# Patient Record
Sex: Female | Born: 1970 | Hispanic: No | Marital: Single | Smoking: Never smoker
Health system: Southern US, Community
[De-identification: ages and names within clinical notes are randomized; demographics above are authoritative.]

## PROBLEM LIST (undated history)

## (undated) DIAGNOSIS — IMO0002 Reserved for concepts with insufficient information to code with codable children: Secondary | ICD-10-CM

## (undated) DIAGNOSIS — J45909 Unspecified asthma, uncomplicated: Secondary | ICD-10-CM

## (undated) DIAGNOSIS — R87619 Unspecified abnormal cytological findings in specimens from cervix uteri: Secondary | ICD-10-CM

## (undated) DIAGNOSIS — Z9889 Other specified postprocedural states: Secondary | ICD-10-CM

## (undated) DIAGNOSIS — F32A Depression, unspecified: Secondary | ICD-10-CM

## (undated) DIAGNOSIS — F419 Anxiety disorder, unspecified: Secondary | ICD-10-CM

## (undated) DIAGNOSIS — F329 Major depressive disorder, single episode, unspecified: Secondary | ICD-10-CM

## (undated) HISTORY — DX: Other specified postprocedural states: Z98.890

## (undated) HISTORY — DX: Reserved for concepts with insufficient information to code with codable children: IMO0002

## (undated) HISTORY — DX: Unspecified asthma, uncomplicated: J45.909

## (undated) HISTORY — DX: Unspecified abnormal cytological findings in specimens from cervix uteri: R87.619

## (undated) HISTORY — PX: WISDOM TOOTH EXTRACTION: SHX21

---

## 1998-10-09 ENCOUNTER — Other Ambulatory Visit: Admission: RE | Admit: 1998-10-09 | Discharge: 1998-10-09 | Payer: Self-pay | Admitting: Obstetrics & Gynecology

## 1999-01-28 ENCOUNTER — Other Ambulatory Visit: Admission: RE | Admit: 1999-01-28 | Discharge: 1999-01-28 | Payer: Self-pay | Admitting: Obstetrics and Gynecology

## 1999-08-28 ENCOUNTER — Other Ambulatory Visit: Admission: RE | Admit: 1999-08-28 | Discharge: 1999-08-28 | Payer: Self-pay | Admitting: Obstetrics & Gynecology

## 1999-11-06 ENCOUNTER — Other Ambulatory Visit: Admission: RE | Admit: 1999-11-06 | Discharge: 1999-11-06 | Payer: Self-pay | Admitting: Obstetrics & Gynecology

## 2000-11-17 ENCOUNTER — Other Ambulatory Visit: Admission: RE | Admit: 2000-11-17 | Discharge: 2000-11-17 | Payer: Self-pay | Admitting: Obstetrics and Gynecology

## 2001-08-11 ENCOUNTER — Ambulatory Visit (HOSPITAL_COMMUNITY): Admission: RE | Admit: 2001-08-11 | Discharge: 2001-08-11 | Payer: Self-pay | Admitting: *Deleted

## 2001-11-17 ENCOUNTER — Other Ambulatory Visit: Admission: RE | Admit: 2001-11-17 | Discharge: 2001-11-17 | Payer: Self-pay | Admitting: Obstetrics and Gynecology

## 2001-11-22 ENCOUNTER — Encounter: Admission: RE | Admit: 2001-11-22 | Discharge: 2001-11-22 | Payer: Self-pay | Admitting: Obstetrics and Gynecology

## 2001-11-22 ENCOUNTER — Encounter: Payer: Self-pay | Admitting: Obstetrics and Gynecology

## 2002-11-22 ENCOUNTER — Other Ambulatory Visit: Admission: RE | Admit: 2002-11-22 | Discharge: 2002-11-22 | Payer: Self-pay | Admitting: Obstetrics and Gynecology

## 2002-11-27 ENCOUNTER — Encounter: Payer: Self-pay | Admitting: Obstetrics and Gynecology

## 2002-11-27 ENCOUNTER — Encounter: Admission: RE | Admit: 2002-11-27 | Discharge: 2002-11-27 | Payer: Self-pay | Admitting: Obstetrics and Gynecology

## 2003-12-04 ENCOUNTER — Other Ambulatory Visit: Admission: RE | Admit: 2003-12-04 | Discharge: 2003-12-04 | Payer: Self-pay | Admitting: Obstetrics and Gynecology

## 2004-09-07 HISTORY — PX: COLPOSCOPY: SHX161

## 2004-12-03 ENCOUNTER — Other Ambulatory Visit: Admission: RE | Admit: 2004-12-03 | Discharge: 2004-12-03 | Payer: Self-pay | Admitting: Obstetrics and Gynecology

## 2005-01-15 ENCOUNTER — Ambulatory Visit: Payer: Self-pay | Admitting: Oncology

## 2005-01-22 ENCOUNTER — Ambulatory Visit (HOSPITAL_COMMUNITY): Admission: RE | Admit: 2005-01-22 | Discharge: 2005-01-22 | Payer: Self-pay | Admitting: Obstetrics and Gynecology

## 2005-01-22 ENCOUNTER — Encounter (INDEPENDENT_AMBULATORY_CARE_PROVIDER_SITE_OTHER): Payer: Self-pay | Admitting: *Deleted

## 2005-06-08 ENCOUNTER — Other Ambulatory Visit: Admission: RE | Admit: 2005-06-08 | Discharge: 2005-06-08 | Payer: Self-pay | Admitting: Obstetrics and Gynecology

## 2005-10-15 ENCOUNTER — Other Ambulatory Visit: Admission: RE | Admit: 2005-10-15 | Discharge: 2005-10-15 | Payer: Self-pay | Admitting: Obstetrics and Gynecology

## 2006-07-07 ENCOUNTER — Other Ambulatory Visit: Admission: RE | Admit: 2006-07-07 | Discharge: 2006-07-07 | Payer: Self-pay | Admitting: Obstetrics and Gynecology

## 2007-09-13 ENCOUNTER — Encounter: Admission: RE | Admit: 2007-09-13 | Discharge: 2007-09-13 | Payer: Self-pay | Admitting: Obstetrics and Gynecology

## 2008-09-13 ENCOUNTER — Encounter: Admission: RE | Admit: 2008-09-13 | Discharge: 2008-09-13 | Payer: Self-pay | Admitting: Oncology

## 2009-09-16 ENCOUNTER — Encounter: Admission: RE | Admit: 2009-09-16 | Discharge: 2009-09-16 | Payer: Self-pay | Admitting: Obstetrics and Gynecology

## 2009-12-20 ENCOUNTER — Encounter: Admission: RE | Admit: 2009-12-20 | Discharge: 2009-12-20 | Payer: Self-pay | Admitting: Obstetrics and Gynecology

## 2010-09-17 ENCOUNTER — Encounter
Admission: RE | Admit: 2010-09-17 | Discharge: 2010-09-17 | Payer: Self-pay | Source: Home / Self Care | Attending: Obstetrics and Gynecology | Admitting: Obstetrics and Gynecology

## 2010-12-28 ENCOUNTER — Emergency Department (HOSPITAL_COMMUNITY)
Admission: EM | Admit: 2010-12-28 | Discharge: 2010-12-28 | Disposition: A | Discharge status: Home / Self Care | Payer: 59 | Attending: Emergency Medicine | Admitting: Emergency Medicine

## 2010-12-28 DIAGNOSIS — S61209A Unspecified open wound of unspecified finger without damage to nail, initial encounter: Secondary | ICD-10-CM | POA: Insufficient documentation

## 2010-12-28 DIAGNOSIS — Z79899 Other long term (current) drug therapy: Secondary | ICD-10-CM | POA: Insufficient documentation

## 2010-12-28 DIAGNOSIS — W292XXA Contact with other powered household machinery, initial encounter: Secondary | ICD-10-CM | POA: Insufficient documentation

## 2011-01-23 NOTE — H&P (Signed)
[name], Kimberly Harding               ACCOUNT NO.:  [id number]   MEDICAL RECORD NO.:  [id number]          PATIENT TYPE:   LOCATION:                                 FACILITY:   PHYSICIAN:  Hal Morales, M.D.DATE OF BIRTH:  09/02/2004   DATE OF ADMISSION:  01/09/2005  DATE OF DISCHARGE:                                HISTORY & PHYSICAL   HISTORY OF PRESENT ILLNESS:  The patient is a 40 year old white single  female, para 0-0-1-0, who presents for cold knife conization of the cervix  for recurrent cervical intraepithelial neoplasia, and for vulvar biopsies  for recurrent vulvar irritation with intermittent ulceration.  The patient  had her last Pap smear on December 03, 2004, which showed low-grade squamous  intraepithelial lesion.      VPH/MEDQ  D:  01/05/2005  T:  01/05/2005  Job:  [id number]

## 2011-01-23 NOTE — H&P (Signed)
Kimberly Harding, Kimberly Harding              ACCOUNT NO.:  [id number]   MEDICAL RECORD NO.:  [id number]           PATIENT TYPE:   LOCATION:                                 FACILITY:   PHYSICIAN:  Hal Morales, M.D.DATE OF BIRTH:  10/07/1970   DATE OF ADMISSION:  01/09/2005  DATE OF DISCHARGE:                                HISTORY & PHYSICAL   HISTORY OF PRESENT ILLNESS:  The patient is a 40 year old white single  female, para 0-0-1-0, who presents with cold knife conization of the cervix  for abnormal genital cytology, and for vulvar biopsies for persistent and  recurrent vulvar irritation with negative work-up to date.  Her past  gynecologic history is significant for the occurrence of an abnormal Pap  smear in 1997 showing low-grade squamous intraepithelial lesion.  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  She  also had chronic cervicitis, but the endocervical curettings were negative.  The patient was treated with cryotherapy, and had normal Pap smears  subsequent to that, except for a Pap smear showing ASCUS in 2000, which  subsequently returned to normal.  Her last Pap smear was done in March of  2006, and showed low-grade squamous intraepithelial lesion.  At that time,  colposcopy was performed, with the finding of no exocervical lesions.  Endocervical curettings, however, did show fragments of dysplastic squamous  mucosa and fragments of benign endocervical mucosa.  In light of the  positive endocervical curettings, a recommendation was made that the patient  undergo cold knife conization of the cervix.   The patient has been seen numerous times over the last several years  complaining of recurrent vulvar irritation.  She has had numerous rounds of  STD testing, including negative gonorrhea and Chlamydia, HIV, hepatitis B  surface antigen, and RPR testing.  Most recently, she underwent HSV-1 and 2  glycoprotein testing, which, likewise, returned  negative.  The patient  continues to have difficulty with bilateral vulvar irritation, sometimes  with ulceration, always with some degree of itching, that has been minimally  responsive to antifungal agents and steroid creams.  She thus wishes to  undergo biopsy for a definitive diagnosis.   PAST OBSTETRICAL HISTORY:  The patient has had one pregnancy which was  terminated in the first trimester greater than 10 years ago.  This was  without complication.   MEDICAL HISTORY:  The patient has a history of exercise-induced asthma, for  which she is treated on an as-needed basis.  She also has a history of  depression.   FAMILY HISTORY:  Significant for hypertension in her mother and her maternal  grandmother.  She likewise has a family history of breast cancer in her  mother, maternal grandmother, and maternal aunt.  She was evaluated at the  [facility] by Dr. Cyndie Chime for this family history to  determine whether she was a candidate for prophylactic therapy.  She  actually, based on her Dondra Spry model, came out with a very low risk of chance  of breast cancer of 0.6% over the next 5 years, compared with a standard  risk  of 0.2%.  It was thus recommended that she was not a candidate for  preventative therapy, but it was recommended that her mother undergo BRCA-1  and BRCA-2 gene testing.   REVIEW OF SYSTEMS:  Negative.   DRUG ALLERGIES:  None known.   CURRENT MEDICATIONS:  1.  Albuterol inhaler p.r.n.  2.  Lexapro 20 mg p.o. daily.  3.  Ambien 10 mg p.o. p.r.n.  4.  Provigil 10 mg p.o. p.r.n.   PHYSICAL EXAMINATION:  GENERAL:  The patient is a well-developed white  female in no acute distress.  VITAL SIGNS:  The blood pressure is 110/72, the weight is 175 pounds.  HEENT:  The thyroid is normal and nontender.  BREASTS:  Without dominant masses, discharge, skin changes, or nipple  retraction.  ABDOMEN:  Soft without masses or organomegaly.  EXTREMITIES:  No clubbing,  cyanosis, or edema.  PELVIC:  EG/BUS within normal limits.  The vagina is rugose.  The cervix is  without gross lesions.  The uterus is normal size, shape, consistency,  anterior, mobile, and nontender.  Adnexa with no masses.  RECTOVAGINAL:  No masses.   IMPRESSION:  1.  Recurrent low-grade squamous epithelial lesion with no exocervical      lesions, and a positive endocervical curetting.  2.  Recurrent vulvar lesion with negative sexually transmitted disease      testing including HSV-1 and 2 glycoprotein.   DISPOSITION:  The recommendation is made to the patient for conization of  the cervix for diagnosis and therapy.  The risks of anesthesia, bleeding,  infection, and damage to adjacent organs are all reviewed.  The risk of  preterm labor and delivery is likewise reviewed with the patient.  She  states she wishes to proceed with the cold knife conization of cervix, as  well as biopsies of the vulva, for definitive diagnosis of her vulvar  irritation.  This will be performed at [facility] on Jan 22, 2005.      VPH/MEDQ  D:  01/05/2005  T:  01/05/2005  Job:  [id number]

## 2011-01-23 NOTE — Op Note (Signed)
Kimberly Harding, Kimberly Harding              ACCOUNT NO.:  [id number]   MEDICAL RECORD NO.:  [id number]          PATIENT TYPE:  AMB   LOCATION:  [facility]                           FACILITY:  [facility]   PHYSICIAN:  Hal Morales, M.D.DATE OF BIRTH:  September 20, 1970   DATE OF PROCEDURE:  01/22/2005  DATE OF DISCHARGE:                                 OPERATIVE REPORT   PREOPERATIVE DIAGNOSES:  1.  Low-grade squamous intraepithelial lesion on Papanicolaou smear with a      positive endocervical curetting, rule out endocervical dysplasia.  2.  Pigmented vulvar lesion.  3.  Vulvar irritation.   POSTOPERATIVE DIAGNOSES:  1.  Low-grade squamous intraepithelial lesion on Papanicolaou smear with a      positive endocervical curetting, rule out endocervical dysplasia.  2.  Pigmented vulvar lesion.  3.  Vulvar irritation.   OPERATION:  1.  Cold knife conization of the cervix.  2.  Vulvar biopsies.   SURGEON:  Hal Morales, M.D.   ANESTHESIA:  Monitored anesthesia care and local.   ESTIMATED BLOOD LOSS:  Less than 10 mL.   COMPLICATIONS:  None.   FINDINGS:  There were small pigmented vulvar lesions at the posterior  fourchette on the right and left side.  There was an area and the left groin  at the at the level of the left vulva, which was irritated and excoriated.   PROCEDURE:  The patient was taken to the operating room after appropriate  identification, placed on the operating table.  After placement of equipment  for monitored anesthesia care, she was placed in the lithotomy position.  The perineum and vagina were prepped with multiple layers of Betadine and  draped as a sterile field.  A red Robinson catheter was used to empty the  bladder.  The aforementioned lesions on the vulva were infiltrated with  0.25% Marcaine.  A Graves speculum was placed in the vagina and a  paracervical block achieved with a total of 10 mL of  2% Xylocaine in the 5  and 7 o'clock positions.  The cervix was then  infiltrated with a dilute solution of Pitressin.  Sutures were placed at the 3 and 9 o'clock positions on the cervix and tied  down.  A cone-shaped specimen of tissue including the endocervix was then  excised.  The endocervical area was then curetted.  Sturmdorf sutures were  placed at 6 and 9 o'clock and a piece of Gelfoam placed in the conization  bed.  Hemostasis was noted to be adequate.  A 4 mm punch biopsy was used to  obtain a biopsy from the right posterior fourchette and the left posterior  fourchette and the left groin.  The skin defects were then closed with  subcuticular sutures of 3-0 Vicryl.  Hemostasis was noted to be adequate and  all instruments were removed from the vagina.  The patient was then taken  from the operating room to the recovery room in satisfactory condition  having tolerated the procedure well, with sponge and instrument count  correct.   SPECIMENS TO PATHOLOGY:  1.  Conization of the cervix.  2.  Endocervical, endometrial curettage.  3.  Left posterior fourchette and left groin biopsies.  4.  Right posterior fourchette biopsy.      VPH/MEDQ  D:  01/22/2005  T:  01/22/2005  Job:  [id number]

## 2011-08-10 ENCOUNTER — Other Ambulatory Visit: Payer: Self-pay | Admitting: Oncology

## 2011-08-10 DIAGNOSIS — Z1231 Encounter for screening mammogram for malignant neoplasm of breast: Secondary | ICD-10-CM

## 2011-09-21 ENCOUNTER — Ambulatory Visit
Admission: RE | Admit: 2011-09-21 | Discharge: 2011-09-21 | Disposition: A | Discharge status: Home / Self Care | Payer: 59 | Source: Ambulatory Visit | Attending: Oncology | Admitting: Oncology

## 2011-09-21 DIAGNOSIS — Z1231 Encounter for screening mammogram for malignant neoplasm of breast: Secondary | ICD-10-CM

## 2012-05-17 ENCOUNTER — Other Ambulatory Visit: Payer: Self-pay

## 2012-05-17 ENCOUNTER — Telehealth: Payer: Self-pay | Admitting: Obstetrics and Gynecology

## 2012-05-17 DIAGNOSIS — Z8639 Personal history of other endocrine, nutritional and metabolic disease: Secondary | ICD-10-CM

## 2012-05-17 NOTE — Telephone Encounter (Signed)
CHANDRA/FOLLOW UP

## 2012-05-17 NOTE — Telephone Encounter (Signed)
Lm on vm to cb per telephone call.  

## 2012-05-17 NOTE — Telephone Encounter (Signed)
Tc from pt. Informed pt repeat Vit D level was due in 01/2012 after rx Vit D 50K IU was pres for pt in 10/2011 x 12 weeks. Appt sched 05/18/12 2:00p with lab only for Vit D level. Pt voices understanding.

## 2012-05-18 ENCOUNTER — Other Ambulatory Visit: Payer: BC Managed Care – PPO

## 2012-05-18 DIAGNOSIS — Z8639 Personal history of other endocrine, nutritional and metabolic disease: Secondary | ICD-10-CM

## 2012-06-29 ENCOUNTER — Telehealth: Payer: Self-pay | Admitting: Obstetrics and Gynecology

## 2012-06-29 NOTE — Telephone Encounter (Signed)
Gave pt vitamin D results, informed that it was within normal range, pt wants to know if she is still supposed to get vitamin d rx.

## 2012-06-29 NOTE — Telephone Encounter (Signed)
Returned call to pt, advised that she no longer needs rx for vitamin d but to continue daily supplement consisting of calcium and vitamin d. Pt advised to take [phone] mg of calcium along with [phone] units of vitamin d. Pt agreeable and voiced understanding.

## 2012-06-29 NOTE — Telephone Encounter (Signed)
Kimberly Harding to address.

## 2012-08-16 ENCOUNTER — Other Ambulatory Visit: Payer: Self-pay | Admitting: Oncology

## 2012-08-16 DIAGNOSIS — Z1231 Encounter for screening mammogram for malignant neoplasm of breast: Secondary | ICD-10-CM

## 2012-09-21 ENCOUNTER — Ambulatory Visit
Admission: RE | Admit: 2012-09-21 | Discharge: 2012-09-21 | Disposition: A | Discharge status: Home / Self Care | Payer: BC Managed Care – PPO | Source: Ambulatory Visit | Attending: Oncology | Admitting: Oncology

## 2012-09-21 DIAGNOSIS — Z1231 Encounter for screening mammogram for malignant neoplasm of breast: Secondary | ICD-10-CM

## 2012-09-27 ENCOUNTER — Other Ambulatory Visit: Payer: Self-pay | Admitting: Oncology

## 2012-09-27 DIAGNOSIS — R928 Other abnormal and inconclusive findings on diagnostic imaging of breast: Secondary | ICD-10-CM

## 2012-10-05 ENCOUNTER — Other Ambulatory Visit: Payer: Self-pay | Admitting: Oncology

## 2012-10-05 ENCOUNTER — Ambulatory Visit
Admission: RE | Admit: 2012-10-05 | Discharge: 2012-10-05 | Disposition: A | Discharge status: Home / Self Care | Payer: BC Managed Care – PPO | Source: Ambulatory Visit | Attending: Oncology | Admitting: Oncology

## 2012-10-05 DIAGNOSIS — N63 Unspecified lump in unspecified breast: Secondary | ICD-10-CM

## 2012-10-05 DIAGNOSIS — R928 Other abnormal and inconclusive findings on diagnostic imaging of breast: Secondary | ICD-10-CM

## 2012-10-06 ENCOUNTER — Telehealth: Payer: Self-pay | Admitting: *Deleted

## 2012-10-06 NOTE — Telephone Encounter (Signed)
Pt notified of benign breast bx report per Dr Cyndie Chime.  [facility] & is very pleased.

## 2012-10-06 NOTE — Telephone Encounter (Signed)
Message copied by Sabino Snipes on Thu Oct 06, 2012  1:34 PM ------      Message from: Levert Feinstein      Created: Thu Oct 06, 2012 11:29 AM       Call pt breast biopsy  benign

## 2012-10-12 ENCOUNTER — Encounter: Payer: Self-pay | Admitting: Obstetrics and Gynecology

## 2012-10-17 ENCOUNTER — Ambulatory Visit: Payer: BC Managed Care – PPO | Admitting: Obstetrics and Gynecology

## 2012-10-17 ENCOUNTER — Encounter: Payer: Self-pay | Admitting: Obstetrics and Gynecology

## 2012-10-17 VITALS — BP 112/78 | Ht 67.0 in | Wt 165.0 lb

## 2012-10-17 DIAGNOSIS — N760 Acute vaginitis: Secondary | ICD-10-CM

## 2012-10-17 DIAGNOSIS — B9689 Other specified bacterial agents as the cause of diseases classified elsewhere: Secondary | ICD-10-CM | POA: Insufficient documentation

## 2012-10-17 NOTE — Progress Notes (Signed)
aexLast Pap: 10/2011 WNL: bv  Regular Periods:yes Contraception: condoms  Monthly Breast exam:no Tetanus<20yrs:yes Nl.Bladder Function:yes Daily BMs:yes Healthy Diet:yes Calcium:yes Mammogram:yes Date of Mammogram: 10/05/2012 with left U/S and needle localization bx showing fibradenoma Exercise:yes Have often Exercise: 3 times a week  Seatbelt: yes Abuse at home: no Stressful work:yes Sigmoid-colonoscopy: yes 2009 Bone Density: No PCP: Dr.Miller  Change in PMH: unchanged  Change in NWG:NFAOZHYQM.  Mom had breast cancer at age 66 Pt wants to discuss other Birth Control options  Subjective:    Kimberly Harding is a 42 y.o. female, G1P0010, who presents for an annual exam. No complaints.    History   Social History  . Marital Status: Single    Spouse Name: N/A    Number of Children: N/A  . Years of Education: N/A   Social History Main Topics  . Smoking status: Never Smoker   . Smokeless tobacco: Never Used  . Alcohol Use: Yes  . Drug Use: No  . Sexually Active: Yes    Birth Control/ Protection: Condom   Other Topics Concern  . None   Social History Narrative  . None    Menstrual cycle:   LMP: Patient's last menstrual period was 09/23/2012.           Cycle: Regular without Im bleeding .  Cramps relieved by OTC NSAIDS  The following portions of the patient's history were reviewed and updated as appropriate: allergies, current medications, past family history, past medical history, past social history, past surgical history and problem list.  Review of Systems Pertinent items are noted in HPI. Breast:Negative for breast lump,nipple discharge or nipple retraction Gastrointestinal: Negative for abdominal pain, change in bowel habits or rectal bleeding Urinary:negative   Objective:    BP 112/78  Ht 5\' 7"  (1.702 m)  Wt 165 lb (74.844 kg)  BMI 25.84 kg/m2  LMP 09/23/2012    Weight:  Wt Readings from Last 1 Encounters:  10/17/12 165 lb (74.844 kg)          BMI:  Body mass index is 25.84 kg/(m^2).  General Appearance: Alert, appropriate appearance for age. No acute distress HEENT: Grossly normal Neck / Thyroid: Supple, no masses, nodes or enlargement Lungs: clear to auscultation bilaterally Back: No CVA tenderness Breast Exam: No masses or nodes.No dimpling, nipple retraction or discharge. Cardiovascular: Regular rate and rhythm. S1, S2, no murmur Gastrointestinal: Soft, non-tender, no masses or organomegaly Pelvic Exam: Vulva and vagina appear normal. Bimanual exam reveals normal uterus and adnexa. Rectovaginal: normal rectal, no masses Lymphatic Exam: Non-palpable nodes in neck, clavicular, axillary, or inguinal regions Skin: no rash or abnormalities Neurologic: Normal gait and speech, no tremor  Psychiatric: Alert and oriented, appropriate affect.   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  Will call if becomes sexually active with decision re contraception.  Written info re options given.  No current contraindication to any method.

## 2012-10-17 NOTE — Patient Instructions (Addendum)
Contraception Choices  Birth control (contraception) can stop pregnancy from happening. Different types of birth control work in different ways. Some can:  · Make the mucus in the cervix thick. This makes it hard for sperm to get into the uterus.  · Thin the lining of the uterus. This makes it hard for an egg to attach to the wall of the uterus.  · Stop the ovaries from releasing an egg.  · Block the sperm from reaching the egg.  Certain types of surgery can stop pregnancy from happening. For women, the sugery closes the fallopian tubes (tubal ligation). For men, the surgery stops sperm from releasing during sex (vasectomy).  HORMONAL BIRTH CONTROL  Hormonal birth control stops pregnancy by putting hormones into your body. Types of birth control include:  · A small tube put under the skin of the upper arm (implant). The tube can stay in place for 3 years.  · Shots given every 3 months.  · Pills taken every day or once after sex (intercourse).  · Patches that are changed once a week.  · A ring put into the vagina (vaginal ring). The ring is left in place for 3 weeks and removed for 1 week. Then, a new ring is put in the vagina.  BARRIER BIRTH CONTROL   Barrier birth control blocks sperm from reaching the egg. Types of birth control include:   · A thin covering worn on the penis (female condom) during sex.  · A soft, loose covering put into the vagina (female condom) before sex.  · A rubber bowl that sits over the cervix (diaphragm). The bowl must be made for you. The bowl is put into the vagina before sex. The bowl is left in place for 6 to 8 hours after sex.  · A small, soft cup that fits over the cervix (cervical cap). The cup must be made for you. The cup can be left in place for 48 hours after sex.  · A sponge that is put into the vagina before sex.  · A chemical that kills or blocks sperm from getting into the cervix and uterus (spermicide). The chemical may be a cream, jelly, foam, or pill.  INTRAUTERINE (IUD)  BIRTH CONTROL   IUD birth control is a small, T-shaped piece of plastic. The plastic is put inside the uterus. There are 2 types of IUD:  · Copper IUD. The IUD is covered in copper wire. The copper makes a fluid that kills sperm. It can stay in place for 10 years.  · Hormone IUD. The hormone stops pregnancy from happening. It can stay in place for 5 years.  NATURAL FAMILY PLANNING BIRTH CONTROL   Natural family planning means not having sex or using barrier birth control when the woman is fertile. A woman can:  · Use a calendar to keep track of when she is fertile.  · Use a thermometer to measure her body temperature.  Protect yourself against sexual diseases no matter what type of birth control you use. Talk to your doctor about which type of birth control is best for you.  Document Released: 06/21/2009 Document Revised: 11/16/2011 Document Reviewed: 12/31/2010  Exit[employer]® Patient Information ©2013 [employer].

## 2013-09-28 ENCOUNTER — Other Ambulatory Visit: Payer: Self-pay

## 2013-09-28 DIAGNOSIS — Z1231 Encounter for screening mammogram for malignant neoplasm of breast: Secondary | ICD-10-CM

## 2013-10-13 ENCOUNTER — Ambulatory Visit
Admission: RE | Admit: 2013-10-13 | Discharge: 2013-10-13 | Disposition: A | Discharge status: Home / Self Care | Payer: BC Managed Care – PPO | Source: Ambulatory Visit

## 2013-10-13 DIAGNOSIS — Z1231 Encounter for screening mammogram for malignant neoplasm of breast: Secondary | ICD-10-CM

## 2013-11-03 ENCOUNTER — Emergency Department (HOSPITAL_COMMUNITY)
Admission: EM | Admit: 2013-11-03 | Discharge: 2013-11-03 | Disposition: A | Discharge status: Home / Self Care | Payer: BC Managed Care – PPO | Attending: Emergency Medicine | Admitting: Emergency Medicine

## 2013-11-03 ENCOUNTER — Encounter (HOSPITAL_COMMUNITY): Payer: Self-pay | Admitting: Emergency Medicine

## 2013-11-03 DIAGNOSIS — W261XXA Contact with sword or dagger, initial encounter: Secondary | ICD-10-CM

## 2013-11-03 DIAGNOSIS — W260XXA Contact with knife, initial encounter: Secondary | ICD-10-CM | POA: Insufficient documentation

## 2013-11-03 DIAGNOSIS — J45909 Unspecified asthma, uncomplicated: Secondary | ICD-10-CM | POA: Insufficient documentation

## 2013-11-03 DIAGNOSIS — S61219A Laceration without foreign body of unspecified finger without damage to nail, initial encounter: Secondary | ICD-10-CM

## 2013-11-03 DIAGNOSIS — Y9389 Activity, other specified: Secondary | ICD-10-CM | POA: Insufficient documentation

## 2013-11-03 DIAGNOSIS — Z79899 Other long term (current) drug therapy: Secondary | ICD-10-CM | POA: Insufficient documentation

## 2013-11-03 DIAGNOSIS — S61209A Unspecified open wound of unspecified finger without damage to nail, initial encounter: Secondary | ICD-10-CM | POA: Insufficient documentation

## 2013-11-03 DIAGNOSIS — Y929 Unspecified place or not applicable: Secondary | ICD-10-CM | POA: Insufficient documentation

## 2013-11-03 NOTE — Discharge Instructions (Signed)
Stitches, Staples, or Skin Adhesive Strips  Stitches (sutures), staples, and skin adhesive strips hold the skin together as it heals. They will usually be in place for 7 days or less. HOME CARE  Wash your hands with soap and water before and after you touch your wound.  Only take medicine as told by your doctor.  Cover your wound only if your doctor told you to. Otherwise, leave it open to air.  Do not get your stitches wet or dirty. If they get dirty, dab them gently with a clean washcloth. Wet the washcloth with soapy water. Do not rub. Pat them dry gently.  Do not put medicine or medicated cream on your stitches unless your doctor told you to.  Do not take out your own stitches or staples. Skin adhesive strips will fall off by themselves.  Do not pick at the wound. Picking can cause an infection.  Do not miss your follow-up appointment.  If you have problems or questions, call your doctor. GET HELP RIGHT AWAY IF:   You have a temperature by mouth above 102 F (38.9 C), not controlled by medicine.  You have chills.  You have redness or pain around your stitches.  There is puffiness (swelling) around your stitches.  You notice fluid (drainage) from your stitches.  There is a bad smell coming from your wound. MAKE SURE YOU:  Understand these instructions.  Will watch your condition.  Will get help if you are not doing well or get worse. Document Released: 06/21/2009 Document Revised: 11/16/2011 Document Reviewed: 06/21/2009 [facility] Patient Information 2014 [place], [place].

## 2013-11-03 NOTE — ED Provider Notes (Signed)
CSN: [id number]     Arrival date & time 11/03/13  1414 History  This chart was scribed for non-physician practitioner, Charlann Lange, PA-C working with Saddie Benders. Dorna Mai, MD by Einar Pheasant, ED scribe. This patient was seen in room TR08C/TR08C and the patient's care was started at 4:10 PM.    Chief Complaint  Patient presents with  . Laceration    The history is provided by the patient. No language interpreter was used.   HPI Comments: Kimberly Harding is a 43 y.o. female who presents to the Emergency Department complaining of a laceration to the index finger of her left hand that occurred about 2 hours ago. Pt states she cut herself while cutting chicken. She states that the knife had already cut through the chicken when it cut her finger. She currently rates her associated pain as a 6/10. Pt does not recall when her tetanus vaccine was, but she does state that it was within the last 5 years. She denies any pertinent medical history. Denies any recent injuries other than what brought her to the ED today.    Past Medical History  Diagnosis Date  . Abnormal Pap smear   . Asthma   . H/O colposcopy with cervical biopsy    Past Surgical History  Procedure Laterality Date  . Wisdom tooth extraction     Family History  Problem Relation Age of Onset  . Arthritis Mother   . Breast cancer Mother   . Emphysema Maternal Grandmother   . Asthma Maternal Grandmother   . COPD Maternal Grandmother   . Breast cancer Maternal Grandmother   . Stroke Maternal Grandfather   . Colon cancer Paternal Grandmother   . Stroke Paternal Grandmother    History  Substance Use Topics  . Smoking status: Never Smoker   . Smokeless tobacco: Never Used  . Alcohol Use: Yes   OB History   Grav Para Term Preterm Abortions TAB SAB Ect Mult Living   1    1  1    0     Review of Systems  Skin: Positive for wound (laceration to left index finger).      Allergies  Review of patient's allergies indicates no  known allergies.  Home Medications   Current Outpatient Rx  Name  Route  Sig  Dispense  Refill  . ALPRAZolam (XANAX) 0.25 MG tablet   Oral   Take 0.25 mg by mouth at bedtime as needed for sleep.         Marland Kitchen escitalopram (LEXAPRO) 20 MG tablet   Oral   Take 20 mg by mouth daily.         Marland Kitchen zolpidem (AMBIEN) 10 MG tablet   Oral   Take 10 mg by mouth at bedtime as needed for sleep.          Triage Vitals: BP 140/88  Pulse 104  Temp(Src) 99.1 F (37.3 C) (Oral)  Resp 18  SpO2 97%  LMP 10/20/2013  Physical Exam  Nursing note and vitals reviewed. Constitutional: She is oriented to person, place, and time. She appears well-developed and well-nourished.  HENT:  Head: Normocephalic and atraumatic.  Cardiovascular: Normal rate.   Pulmonary/Chest: Effort normal.  Abdominal: She exhibits no distension.  Neurological: She is alert and oriented to person, place, and time.  Skin: Skin is warm and dry.  Left distal and palmar distal finger has a flap laceration intact skin, no swelling, no active bleed, and full ROM of the digit.  Psychiatric: She  has a normal mood and affect.    ED Course  Procedures (including critical care time)  DIAGNOSTIC STUDIES: Oxygen Saturation is 97% on RA, adequate by my interpretation.    COORDINATION OF CARE: 4:12 PM-Will clean out the laceration. Advised pt that she will not need to get her finger sutured. Pt advised of plan for treatment and pt agrees.  Labs Review Labs Reviewed - No data to display Imaging Review No results found.  EKG Interpretation  None  MDM   Final diagnoses:  None    1. Finger laceration  No sutures required for adequate closure. Dermabond and steristrips used after disinfecting.   I personally performed the services described in this documentation, which was scribed in my presence. The recorded information has been reviewed and is accurate.     Dewaine Oats, PA-C 11/07/13 1318

## 2013-11-03 NOTE — ED Notes (Signed)
Laceration wrapped bulky in gauze and tape after [name], PA steri-stripped and dermabonded wound

## 2013-11-03 NOTE — ED Notes (Signed)
Per pt sts cut her left index finger while slicing chicken. sts last tetanus shot was 2 years ago.

## 2013-11-03 NOTE — ED Notes (Signed)
VS rechecked; pt stated she felt fine; pt stated that she knows that this incident is because she "is in her head" and needs to think positive; pt seems worried that pain will continue in greater magnitude after discharge; pt was consoled and reassured that she may be in pain but not great pain and to keep wound clean and dressing changed.  VS were good upon discharge and pt was walked to registration/discharge without incident.

## 2013-11-14 NOTE — ED Provider Notes (Signed)
Medical screening examination/treatment/procedure(s) were performed by non-physician practitioner and as supervising physician I was immediately available for consultation/collaboration.   EKG Interpretation None        Saddie Benders. Eular Panek, MD 11/14/13 1351

## 2013-12-28 ENCOUNTER — Other Ambulatory Visit: Payer: Self-pay | Admitting: Dermatology

## 2014-06-21 ENCOUNTER — Other Ambulatory Visit: Payer: Self-pay | Admitting: Obstetrics and Gynecology

## 2014-06-21 DIAGNOSIS — N6320 Unspecified lump in the left breast, unspecified quadrant: Principal | ICD-10-CM

## 2014-06-21 DIAGNOSIS — N6325 Unspecified lump in the left breast, overlapping quadrants: Secondary | ICD-10-CM

## 2014-06-28 ENCOUNTER — Other Ambulatory Visit: Payer: BC Managed Care – PPO

## 2014-07-09 ENCOUNTER — Encounter (HOSPITAL_BASED_OUTPATIENT_CLINIC_OR_DEPARTMENT_OTHER): Payer: Self-pay | Admitting: *Deleted

## 2014-07-09 ENCOUNTER — Other Ambulatory Visit (INDEPENDENT_AMBULATORY_CARE_PROVIDER_SITE_OTHER): Payer: Self-pay | Admitting: Surgery

## 2014-07-09 NOTE — Progress Notes (Signed)
No labs needed

## 2014-07-10 NOTE — H&P (Signed)
Kimberly Harding 07/09/2014 10:18 AM Location: [facility] Patient #: [id number] DOB: 08-22-1971 Single / Language: Kimberly Harding / Race: White Female  History of Present Illness (Rhea Thrun A. Ninfa Linden MD; 07/09/2014 10:46 AM) Patient words: Lump on LT breast, possible lumpectomy.  The patient is a 43 year old female who presents with a breast mass. She is referred by Dr. Marcelo Baldy for evaluation of a left breast mass. 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 It has however increased in size from 1-1.5 cm. She also has a strong family history of breast cancer. Excision of the mass has been strongly recommended. She has no previous history of other breast masses. She denies nipple discharge. Her mother was diagnosed with breast cancer at age 6   Other Problems Festus Holts, LPN; 65/12/6501 54:65 AM) Anxiety Disorder Asthma Depression Gastroesophageal Reflux Disease Hemorrhoids Lump In Breast  Past Surgical History Festus Holts, LPN; 68/09/2749 70:01 AM) Breast Biopsy Left. Oral Surgery  Diagnostic Studies History Festus Holts, LPN; 74/05/4495 75:91 AM) Colonoscopy 5-10 years ago Mammogram within last year Pap Smear 1-5 years ago  Allergies Festus Holts, LPN; 63/04/4664 99:35 AM) No Known Drug Allergies11/10/2013  Medication History Festus Holts, LPN; 70/09/7791 90:30 AM) Xanax (0.25MG Tablet, Oral) Active. Lexapro (20MG Tablet, Oral) Active. Ambien (10MG Tablet, Oral) Active. Abilify (2MG Tablet, Oral) Active.  Social History Festus Holts, LPN; 05/09/3299 76:22 AM) Alcohol use Occasional alcohol use. Caffeine use Carbonated beverages, Coffee, Tea. No drug use Tobacco use Never smoker.  Family History Festus Holts, LPN; 63/11/3543 62:56 AM) Alcohol Abuse Father. Arthritis Mother. Breast Cancer Family Members In General, Mother. Cerebrovascular Accident Family Members In [place]  Members In General. Depression Brother. Hypertension Mother.  Pregnancy / Birth History Festus Holts, LPN; 38/05/3733 28:76 AM) Age at menarche 39 years. Contraceptive History Depo-provera, Oral contraceptives. Gravida 1 Maternal age 57-20 Para 0 Regular periods  Review of Systems Festus Holts LPN; 81/09/5724 20:35 AM) General Not Present- Appetite Loss, Chills, Fatigue, Fever, Night Sweats, Weight Gain and Weight Loss. Skin Present- Dryness. Not Present- Change in Wart/Mole, Hives, Jaundice, New Lesions, Non-Healing Wounds, Rash and Ulcer. HEENT Present- Seasonal Allergies. Not Present- Earache, Hearing Loss, Hoarseness, Nose Bleed, Oral Ulcers, Ringing in the Ears, Sinus Pain, Sore Throat, Visual Disturbances, Wears glasses/contact lenses and Yellow Eyes. Respiratory Not Present- Bloody sputum, Chronic Cough, Difficulty Breathing, Snoring and Wheezing. Breast Present- Breast Mass and Breast Pain. Not Present- Nipple Discharge and Skin Changes. Cardiovascular Not Present- Chest Pain, Difficulty Breathing Lying Down, Leg Cramps, Palpitations, Rapid Heart Rate, Shortness of Breath and Swelling of Extremities. Gastrointestinal Not Present- Abdominal Pain, Bloating, Bloody Stool, Change in Bowel Habits, Chronic diarrhea, Constipation, Difficulty Swallowing, Excessive gas, Gets full quickly at meals, Hemorrhoids, Indigestion, Nausea, Rectal Pain and Vomiting. Female Genitourinary Not Present- Frequency, Nocturia, Painful Urination, Pelvic Pain and Urgency. Musculoskeletal Not Present- Back Pain, Joint Pain, Joint Stiffness, Muscle Pain, Muscle Weakness and Swelling of Extremities. Neurological Not Present- Decreased Memory, Fainting, Headaches, Numbness, Seizures, Tingling, Tremor, Trouble walking and Weakness. Psychiatric Present- Depression. Not Present- Anxiety, Bipolar, Change in Sleep Pattern, Fearful and Frequent crying. Endocrine Not Present- Cold Intolerance, Excessive Hunger,  Hair Changes, Heat Intolerance, Hot flashes and New Diabetes. Hematology Present- Easy Bruising. Not Present- Excessive bleeding, Gland problems, HIV and Persistent Infections.   Vitals Festus Holts LPN; 59/03/4162 84:53 AM) 07/09/2014 10:20 AM Weight: 167 lb Height: 65in Body Surface Area: 1.86 m Body Mass Index: 27.79 kg/m Temp.: 98.37F  Pulse:  66 (Regular)  Resp.: 18 (Unlabored)  BP: 122/88 (Sitting, Left Arm, Standard)    Physical Exam (Nester Bachus A. Ninfa Linden MD; 07/09/2014 10:49 AM) General Mental Status-Alert. General Appearance-Consistent with stated age. Hydration-Well hydrated. Voice-Normal.  Head and Neck Head-normocephalic, atraumatic with no lesions or palpable masses. Trachea-midline. Thyroid Gland Characteristics - normal size and consistency.  Eye Eyeball - Bilateral-Extraocular movements intact. Sclera/Conjunctiva - Bilateral-No scleral icterus.  Chest and Lung Exam Chest and lung exam reveals -quiet, even and easy respiratory effort with no use of accessory muscles and on auscultation, normal breath sounds, no adventitious sounds and normal vocal resonance. Inspection Chest Wall - Normal. Back - normal.  Breast Note: There is approximately a 1 cm mass at the 6:00 position of the left breast just below the areola. There is no axillary adenopathy on either side. There are no other palpable masses in left breast   Cardiovascular Cardiovascular examination reveals -normal heart sounds, regular rate and rhythm with no murmurs and normal pedal pulses bilaterally.  Abdomen Inspection Inspection of the abdomen reveals - No Hernias. Skin - Scar - no surgical scars. Palpation/Percussion Palpation and Percussion of the abdomen reveal - Soft, Non Tender, No Rebound tenderness, No Rigidity (guarding) and No hepatosplenomegaly. Auscultation Auscultation of the abdomen reveals - Bowel sounds normal.  Neurologic Neurologic  evaluation reveals -alert and oriented x 3 with no impairment of recent or remote memory. Mental Status-Normal.  Musculoskeletal Normal Exam - Left-Upper Extremity Strength Normal and Lower Extremity Strength Normal. Normal Exam - Right-Upper Extremity Strength Normal and Lower Extremity Strength Normal.  Lymphatic Head & Neck  General Head & Neck Lymphatics: Bilateral - Description - Normal. Axillary  General Axillary Region: Bilateral - Description - Normal. Tenderness - Non Tender. Femoral & Inguinal  Generalized Femoral & Inguinal Lymphatics: Bilateral - Description - Normal. Tenderness - Non Tender.    Assessment & Plan (Jan Walters A. Ninfa Linden MD; 07/09/2014 10:51 AM) BREAST MASS, LEFT ([phone]  N63) Impression: I discussed this with the patient in detail. Because of the increase in the size of the mass and her family history, left breast lumpectomy is recommended for histologic evaluation to rule out malignancy. 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 I discussed the surgery with her in detail. I discussed the risks of surgery which includes but is not limited to bleeding, infection, injury to surrounding structures, need for further surgery if malignancy is present, et Ronney Asters. I also discussed postoperative recovery. She understands and wishes to proceed.     Signed by Harl Bowie, MD (07/09/2014 10:52 AM)

## 2014-07-11 ENCOUNTER — Ambulatory Visit (HOSPITAL_BASED_OUTPATIENT_CLINIC_OR_DEPARTMENT_OTHER): Payer: BC Managed Care – PPO | Admitting: Certified Registered"

## 2014-07-11 ENCOUNTER — Encounter (HOSPITAL_BASED_OUTPATIENT_CLINIC_OR_DEPARTMENT_OTHER): Payer: Self-pay | Admitting: Certified Registered"

## 2014-07-11 ENCOUNTER — Encounter (HOSPITAL_BASED_OUTPATIENT_CLINIC_OR_DEPARTMENT_OTHER)
Admission: RE | Disposition: A | Discharge status: Home / Self Care | Payer: Self-pay | Source: Ambulatory Visit | Attending: Surgery

## 2014-07-11 ENCOUNTER — Ambulatory Visit (HOSPITAL_BASED_OUTPATIENT_CLINIC_OR_DEPARTMENT_OTHER)
Admission: RE | Admit: 2014-07-11 | Discharge: 2014-07-11 | Disposition: A | Discharge status: Home / Self Care | Payer: BC Managed Care – PPO | Source: Ambulatory Visit | Attending: Surgery | Admitting: Surgery

## 2014-07-11 DIAGNOSIS — F419 Anxiety disorder, unspecified: Secondary | ICD-10-CM | POA: Diagnosis not present

## 2014-07-11 DIAGNOSIS — D242 Benign neoplasm of left breast: Secondary | ICD-10-CM | POA: Insufficient documentation

## 2014-07-11 DIAGNOSIS — F329 Major depressive disorder, single episode, unspecified: Secondary | ICD-10-CM | POA: Insufficient documentation

## 2014-07-11 DIAGNOSIS — N63 Unspecified lump in breast: Secondary | ICD-10-CM | POA: Diagnosis present

## 2014-07-11 DIAGNOSIS — J45909 Unspecified asthma, uncomplicated: Secondary | ICD-10-CM | POA: Diagnosis not present

## 2014-07-11 DIAGNOSIS — K219 Gastro-esophageal reflux disease without esophagitis: Secondary | ICD-10-CM | POA: Insufficient documentation

## 2014-07-11 DIAGNOSIS — K649 Unspecified hemorrhoids: Secondary | ICD-10-CM | POA: Insufficient documentation

## 2014-07-11 DIAGNOSIS — Z803 Family history of malignant neoplasm of breast: Secondary | ICD-10-CM | POA: Diagnosis not present

## 2014-07-11 HISTORY — DX: Depression, unspecified: F32.A

## 2014-07-11 HISTORY — DX: Major depressive disorder, single episode, unspecified: F32.9

## 2014-07-11 HISTORY — DX: Anxiety disorder, unspecified: F41.9

## 2014-07-11 HISTORY — PX: BREAST LUMPECTOMY: SHX2

## 2014-07-11 LAB — POCT HEMOGLOBIN-HEMACUE: Hemoglobin: 11.5 g/dL — ABNORMAL LOW (ref 12.0–15.0)

## 2014-07-11 SURGERY — BREAST LUMPECTOMY
Anesthesia: General | Laterality: Left

## 2014-07-11 MED ORDER — MIDAZOLAM HCL 2 MG/2ML IJ SOLN: 1.0000 mg | INTRAMUSCULAR | Status: DC | PRN

## 2014-07-11 MED ORDER — SODIUM BICARBONATE 4 % IV SOLN
INTRAVENOUS | Status: AC
  Filled 2014-07-11: qty 5

## 2014-07-11 MED ORDER — SODIUM CHLORIDE 0.9 % IV SOLN: 250.0000 mL | INTRAVENOUS | Status: DC | PRN

## 2014-07-11 MED ORDER — BUPIVACAINE-EPINEPHRINE 0.5% -1:200000 IJ SOLN
INTRAMUSCULAR | Status: DC | PRN
  Administered 2014-07-11: 20 mL

## 2014-07-11 MED ORDER — HYDROMORPHONE HCL 1 MG/ML IJ SOLN
0.2500 mg | INTRAMUSCULAR | Status: DC | PRN
  Administered 2014-07-11: 0.5 mg via INTRAVENOUS

## 2014-07-11 MED ORDER — CEFAZOLIN SODIUM 1-5 GM-% IV SOLN
INTRAVENOUS | Status: AC
  Filled 2014-07-11: qty 100

## 2014-07-11 MED ORDER — ACETAMINOPHEN 325 MG PO TABS: 650.0000 mg | ORAL_TABLET | ORAL | Status: DC | PRN

## 2014-07-11 MED ORDER — HYDROCODONE-ACETAMINOPHEN 5-325 MG PO TABS: 1.0000 | ORAL_TABLET | ORAL | Status: AC | PRN

## 2014-07-11 MED ORDER — SODIUM CHLORIDE 0.9 % IJ SOLN: 3.0000 mL | Freq: Two times a day (BID) | INTRAMUSCULAR | Status: DC

## 2014-07-11 MED ORDER — MORPHINE SULFATE 2 MG/ML IJ SOLN: 1.0000 mg | INTRAMUSCULAR | Status: DC | PRN

## 2014-07-11 MED ORDER — KETOROLAC TROMETHAMINE 30 MG/ML IJ SOLN
INTRAMUSCULAR | Status: DC | PRN
  Administered 2014-07-11: 30 mg via INTRAVENOUS

## 2014-07-11 MED ORDER — FENTANYL CITRATE 0.05 MG/ML IJ SOLN: 50.0000 ug | INTRAMUSCULAR | Status: DC | PRN

## 2014-07-11 MED ORDER — FENTANYL CITRATE 0.05 MG/ML IJ SOLN
INTRAMUSCULAR | Status: DC | PRN
  Administered 2014-07-11: 100 ug via INTRAVENOUS

## 2014-07-11 MED ORDER — FENTANYL CITRATE 0.05 MG/ML IJ SOLN
INTRAMUSCULAR | Status: AC
  Filled 2014-07-11: qty 4

## 2014-07-11 MED ORDER — OXYCODONE HCL 5 MG PO TABS: 5.0000 mg | ORAL_TABLET | ORAL | Status: DC | PRN

## 2014-07-11 MED ORDER — ONDANSETRON HCL 4 MG/2ML IJ SOLN
INTRAMUSCULAR | Status: DC | PRN
Start: 2014-07-11 — End: 2014-07-11
  Administered 2014-07-11: 4 mg via INTRAVENOUS

## 2014-07-11 MED ORDER — OXYCODONE HCL 5 MG/5ML PO SOLN: 5.0000 mg | Freq: Once | ORAL | Status: DC | PRN

## 2014-07-11 MED ORDER — DEXAMETHASONE SODIUM PHOSPHATE 4 MG/ML IJ SOLN
INTRAMUSCULAR | Status: DC | PRN
  Administered 2014-07-11: 10 mg via INTRAVENOUS

## 2014-07-11 MED ORDER — SODIUM CHLORIDE 0.9 % IJ SOLN: 3.0000 mL | INTRAMUSCULAR | Status: DC | PRN

## 2014-07-11 MED ORDER — CEFAZOLIN SODIUM-DEXTROSE 2-3 GM-% IV SOLR
2.0000 g | INTRAVENOUS | Status: AC
  Administered 2014-07-11: 2 g via INTRAVENOUS

## 2014-07-11 MED ORDER — PROPOFOL 10 MG/ML IV BOLUS
INTRAVENOUS | Status: DC | PRN
  Administered 2014-07-11: 200 mg via INTRAVENOUS

## 2014-07-11 MED ORDER — MIDAZOLAM HCL 5 MG/5ML IJ SOLN
INTRAMUSCULAR | Status: DC | PRN
  Administered 2014-07-11: 2 mg via INTRAVENOUS

## 2014-07-11 MED ORDER — MIDAZOLAM HCL 2 MG/2ML IJ SOLN
INTRAMUSCULAR | Status: AC
  Filled 2014-07-11: qty 2

## 2014-07-11 MED ORDER — LIDOCAINE HCL (PF) 1 % IJ SOLN
INTRAMUSCULAR | Status: AC
  Filled 2014-07-11: qty 30

## 2014-07-11 MED ORDER — EPHEDRINE SULFATE 50 MG/ML IJ SOLN
INTRAMUSCULAR | Status: DC | PRN
  Administered 2014-07-11: 10 mg via INTRAVENOUS

## 2014-07-11 MED ORDER — ACETAMINOPHEN 650 MG RE SUPP: 650.0000 mg | RECTAL | Status: DC | PRN

## 2014-07-11 MED ORDER — LACTATED RINGERS IV SOLN
INTRAVENOUS | Status: DC
  Administered 2014-07-11: 12:00:00 via INTRAVENOUS

## 2014-07-11 MED ORDER — HYDROMORPHONE HCL 1 MG/ML IJ SOLN
INTRAMUSCULAR | Status: AC
  Filled 2014-07-11: qty 1

## 2014-07-11 MED ORDER — OXYCODONE HCL 5 MG PO TABS: 5.0000 mg | ORAL_TABLET | Freq: Once | ORAL | Status: DC | PRN

## 2014-07-11 MED ORDER — BUPIVACAINE-EPINEPHRINE (PF) 0.5% -1:200000 IJ SOLN
INTRAMUSCULAR | Status: AC
  Filled 2014-07-11: qty 30

## 2014-07-11 MED ORDER — ONDANSETRON HCL 4 MG/2ML IJ SOLN: 4.0000 mg | Freq: Once | INTRAMUSCULAR | Status: DC | PRN

## 2014-07-11 MED ORDER — LIDOCAINE HCL (CARDIAC) 20 MG/ML IV SOLN
INTRAVENOUS | Status: DC | PRN
  Administered 2014-07-11: 80 mg via INTRAVENOUS

## 2014-07-11 SURGICAL SUPPLY — 43 items
APL SKNCLS STERI-STRIP NONHPOA (GAUZE/BANDAGES/DRESSINGS) ×1
BENZOIN TINCTURE PRP APPL 2/3 (GAUZE/BANDAGES/DRESSINGS) ×3 IMPLANT
BLADE HEX COATED 2.75 (ELECTRODE) ×3 IMPLANT
BLADE SURG 15 STRL LF DISP TIS (BLADE) ×1 IMPLANT
BLADE SURG 15 STRL SS (BLADE) ×3
CANISTER SUCT 1200ML W/VALVE (MISCELLANEOUS) ×2 IMPLANT
CHLORAPREP W/TINT 26ML (MISCELLANEOUS) ×3 IMPLANT
CLIP TI WIDE RED SMALL 6 (CLIP) IMPLANT
CLOSURE WOUND 1/2 X4 (GAUZE/BANDAGES/DRESSINGS) ×1
COVER BACK TABLE 60X90IN (DRAPES) ×3 IMPLANT
COVER MAYO STAND STRL (DRAPES) ×3 IMPLANT
DECANTER SPIKE VIAL GLASS SM (MISCELLANEOUS) IMPLANT
DEVICE DUBIN W/COMP PLATE 8390 (MISCELLANEOUS) IMPLANT
DRAPE PED LAPAROTOMY (DRAPES) ×3 IMPLANT
DRAPE UTILITY XL STRL (DRAPES) ×3 IMPLANT
DRSG TEGADERM 4X4.75 (GAUZE/BANDAGES/DRESSINGS) ×3 IMPLANT
ELECT REM PT RETURN 9FT ADLT (ELECTROSURGICAL) ×3
ELECTRODE REM PT RTRN 9FT ADLT (ELECTROSURGICAL) ×1 IMPLANT
GLOVE SURG SIGNA 7.5 PF LTX (GLOVE) ×3 IMPLANT
GOWN STRL REUS W/ TWL LRG LVL3 (GOWN DISPOSABLE) ×1 IMPLANT
GOWN STRL REUS W/ TWL XL LVL3 (GOWN DISPOSABLE) ×1 IMPLANT
GOWN STRL REUS W/TWL LRG LVL3 (GOWN DISPOSABLE) ×3
GOWN STRL REUS W/TWL XL LVL3 (GOWN DISPOSABLE) ×3
KIT MARKER MARGIN INK (KITS) ×1 IMPLANT
NDL HYPO 25X1 1.5 SAFETY (NEEDLE) ×1 IMPLANT
NEEDLE HYPO 25X1 1.5 SAFETY (NEEDLE) ×3 IMPLANT
NS IRRIG 1000ML POUR BTL (IV SOLUTION) ×3 IMPLANT
PACK BASIN DAY SURGERY FS (CUSTOM PROCEDURE TRAY) ×3 IMPLANT
PENCIL BUTTON HOLSTER BLD 10FT (ELECTRODE) ×3 IMPLANT
SLEEVE SCD COMPRESS KNEE MED (MISCELLANEOUS) ×2 IMPLANT
SPONGE GAUZE 4X4 12PLY STER LF (GAUZE/BANDAGES/DRESSINGS) ×3 IMPLANT
SPONGE LAP 4X18 X RAY DECT (DISPOSABLE) ×3 IMPLANT
STRIP CLOSURE SKIN 1/2X4 (GAUZE/BANDAGES/DRESSINGS) ×2 IMPLANT
SUT MNCRL AB 4-0 PS2 18 (SUTURE) ×3 IMPLANT
SUT SILK 2 0 SH (SUTURE) ×3 IMPLANT
SUT VIC AB 3-0 SH 27 (SUTURE) ×3
SUT VIC AB 3-0 SH 27X BRD (SUTURE) ×1 IMPLANT
SYR CONTROL 10ML LL (SYRINGE) ×3 IMPLANT
TOWEL OR 17X24 6PK STRL BLUE (TOWEL DISPOSABLE) ×3 IMPLANT
TOWEL OR NON WOVEN STRL DISP B (DISPOSABLE) ×3 IMPLANT
TUBE CONNECTING 20'X1/4 (TUBING) ×1
TUBE CONNECTING 20X1/4 (TUBING) ×1 IMPLANT
YANKAUER SUCT BULB TIP NO VENT (SUCTIONS) ×2 IMPLANT

## 2014-07-11 NOTE — Anesthesia Postprocedure Evaluation (Signed)
  Anesthesia Post-op Note  Patient: Kimberly Harding  Procedure(s) Performed: Procedure(s): LEFT LUMPECTOMY (Left)  Patient Location: PACU  Anesthesia Type: General   Level of Consciousness: awake, alert  and oriented  Airway and Oxygen Therapy: Patient Spontanous Breathing  Post-op Pain: mild  Post-op Assessment: Post-op Vital signs reviewed  Post-op Vital Signs: Reviewed  Last Vitals:  Filed Vitals:   07/11/14 1350  BP: 112/79  Pulse: 68  Temp: 36.4 C  Resp: 18    Complications: No apparent anesthesia complications

## 2014-07-11 NOTE — Interval H&P Note (Signed)
History and Physical Interval Note: no change in H and P  07/11/2014 11:39 AM  Kimberly Harding  has presented today for surgery, with the diagnosis of LEFT Breast Cancer  The various methods of treatment have been discussed with the patient and family. After consideration of risks, benefits and other options for treatment, the patient has consented to  Procedure(s): LEFT LUMPECTOMY (Left) as a surgical intervention .  The patient's history has been reviewed, patient examined, no change in status, stable for surgery.  I have reviewed the patient's chart and labs.  Questions were answered to the patient's satisfaction.     [name] A

## 2014-07-11 NOTE — Transfer of Care (Signed)
Immediate Anesthesia Transfer of Care Note  Patient: Kimberly Harding  Procedure(s) Performed: Procedure(s): LEFT LUMPECTOMY (Left)  Patient Location: PACU  Anesthesia Type:General  Level of Consciousness: awake, alert  and patient cooperative  Airway & Oxygen Therapy: Patient Spontanous Breathing and Patient connected to face mask oxygen  Post-op Assessment: Report given to PACU RN, Post -op Vital signs reviewed and stable and Patient moving all extremities  Post vital signs: Reviewed and stable  Complications: No apparent anesthesia complications

## 2014-07-11 NOTE — Discharge Instructions (Signed)
[place] Office Phone Number [phone]  BREAST BIOPSY/ PARTIAL MASTECTOMY: POST OP INSTRUCTIONS  Always review your discharge instruction sheet given to you by the facility where your surgery was performed.  IF YOU HAVE DISABILITY OR FAMILY LEAVE FORMS, YOU MUST BRING THEM TO THE OFFICE FOR PROCESSING.  DO NOT GIVE THEM TO YOUR DOCTOR.  1. A prescription for pain medication may be given to you upon discharge.  Take your pain medication as prescribed, if needed.  If narcotic pain medicine is not needed, then you may take acetaminophen (Tylenol) or ibuprofen (Advil) as needed. 2. Take your usually prescribed medications unless otherwise directed 3. If you need a refill on your pain medication, please contact your pharmacy.  They will contact our office to request authorization.  Prescriptions will not be filled after 5pm or on week-ends. 4. You should eat very light the first 24 hours after surgery, such as soup, crackers, pudding, etc.  Resume your normal diet the day after surgery. 5. Most patients will experience some swelling and bruising in the breast.  Ice packs and a good support bra will help.  Swelling and bruising can take several days to resolve.  6. It is common to experience some constipation if taking pain medication after surgery.  Increasing fluid intake and taking a stool softener will usually help or prevent this problem from occurring.  A mild laxative (Milk of Magnesia or Miralax) should be taken according to package directions if there are no bowel movements after 48 hours. 7. Unless discharge instructions indicate otherwise, you may remove your bandages 24-48 hours after surgery, and you may shower at that time.  You may have steri-strips (small skin tapes) in place directly over the incision.  These strips should be left on the skin for 7-10 days.  If your surgeon used skin glue on the incision, you may shower in 24 hours.  The glue will flake off over the  next 2-3 weeks.  Any sutures or staples will be removed at the office during your follow-up visit. 8. ACTIVITIES:  You may resume regular daily activities (gradually increasing) beginning the next day.  Wearing a good support bra or sports bra minimizes pain and swelling.  You may have sexual intercourse when it is comfortable. a. You may drive when you no longer are taking prescription pain medication, you can comfortably wear a seatbelt, and you can safely maneuver your car and apply brakes. b. RETURN TO WORK:  ______________________________________________________________________________________ 9. You should see your doctor in the office for a follow-up appointment approximately two weeks after your surgery.  Your doctors nurse will typically make your follow-up appointment when she calls you with your pathology report.  Expect your pathology report 2-3 business days after your surgery.  You may call to check if you do not hear from [place] after three days. 10. OTHER INSTRUCTIONS: _______________________________________________________________________________________________ _____________________________________________________________________________________________________________________________________ _____________________________________________________________________________________________________________________________________ _____________________________________________________________________________________________________________________________________  WHEN TO CALL YOUR DOCTOR: 1. Fever over 101.0 2. Nausea and/or vomiting. 3. Extreme swelling or bruising. 4. Continued bleeding from incision. 5. Increased pain, redness, or drainage from the incision.  The clinic staff is available to answer your questions during regular business hours.  Please dont hesitate to call and ask to speak to one of the nurses for clinical concerns.  If you have a medical emergency, go to the nearest  emergency room or call 911.  A surgeon from [facility] is always on call at the hospital.  For further questions, please visit [email or web address]  Post Anesthesia Home Care Instructions  Activity: Get plenty of rest for the remainder of the day. A responsible adult should stay with you for 24 hours following the procedure.  For the next 24 hours, DO NOT: -Drive a car -Paediatric nurse -Drink alcoholic beverages -Take any medication unless instructed by your physician -Make any legal decisions or sign important papers.  Meals: Start with liquid foods such as gelatin or soup. Progress to regular foods as tolerated. Avoid greasy, spicy, heavy foods. If nausea and/or vomiting occur, drink only clear liquids until the nausea and/or vomiting subsides. Call your physician if vomiting continues.  Special Instructions/Symptoms: Your throat may feel dry or sore from the anesthesia or the breathing tube placed in your throat during surgery. If this causes discomfort, gargle with warm salt water. The discomfort should disappear within 24 hours.

## 2014-07-11 NOTE — Anesthesia Procedure Notes (Signed)
Procedure Name: LMA Insertion Date/Time: 07/11/2014 12:06 PM Performed by: Baxter Flattery Pre-anesthesia Checklist: Patient identified, Emergency Drugs available, Suction available and Patient being monitored Patient Re-evaluated:Patient Re-evaluated prior to inductionOxygen Delivery Method: Circle System Utilized Preoxygenation: Pre-oxygenation with 100% oxygen Intubation Type: IV induction Ventilation: Mask ventilation without difficulty LMA: LMA inserted LMA Size: 4.0 Number of attempts: 1 Airway Equipment and Method: bite block Placement Confirmation: positive ETCO2 and breath sounds checked- equal and bilateral Tube secured with: Tape Dental Injury: Teeth and Oropharynx as per pre-operative assessment

## 2014-07-11 NOTE — Op Note (Signed)
LEFT LUMPECTOMY  Procedure Note  Kimberly Harding 07/11/2014   Pre-op Diagnosis: Left Breast Mass     Post-op Diagnosis: same  Procedure(s): LEFT BREAST LUMPECTOMY  Surgeon(s): Coralie Keens, MD  Anesthesia: General  Staff:  Circulator: Lauris Chroman, RN Scrub Person: Levora Angel, RN  Estimated Blood Loss: Minimal               Specimens: sent to path          [facility] A   Date: 07/11/2014  Time: 12:35 PM

## 2014-07-11 NOTE — Anesthesia Preprocedure Evaluation (Signed)
Anesthesia Evaluation  Patient identified by MRN, date of birth, ID band Patient awake    Reviewed: Allergy & Precautions, H&P , NPO status , Patient's Chart, lab work & pertinent test results, Unable to perform ROS - Chart review only  Airway Mallampati: I  TM Distance: >3 FB Neck ROM: Full    Dental  (+) Teeth Intact, Dental Advisory Given   Pulmonary          Cardiovascular Rhythm:Regular Rate:Normal     Neuro/Psych    GI/Hepatic   Endo/Other    Renal/GU      Musculoskeletal   Abdominal   Peds  Hematology   Anesthesia Other Findings   Reproductive/Obstetrics                             Anesthesia Physical Anesthesia Plan  ASA: I  Anesthesia Plan: General   Post-op Pain Management:    Induction: Intravenous  Airway Management Planned: LMA  Additional Equipment:   Intra-op Plan:   Post-operative Plan: Extubation in OR  Informed Consent: I have reviewed the patients History and Physical, chart, labs and discussed the procedure including the risks, benefits and alternatives for the proposed anesthesia with the patient or authorized representative who has indicated his/her understanding and acceptance.   Dental advisory given  Plan Discussed with: CRNA, Anesthesiologist and Surgeon  Anesthesia Plan Comments:         Anesthesia Quick Evaluation

## 2014-07-12 ENCOUNTER — Encounter (HOSPITAL_BASED_OUTPATIENT_CLINIC_OR_DEPARTMENT_OTHER): Payer: Self-pay | Admitting: Surgery

## 2014-07-12 NOTE — Op Note (Signed)
Kimberly Harding, Kimberly Harding              ACCOUNT NO.:  [id number]  MEDICAL RECORD NO.:  [id number]  LOCATION:                                 FACILITY:  PHYSICIAN:  Coralie Keens, M.D. DATE OF BIRTH:  07/11/2013  DATE OF PROCEDURE:  07/11/2014 DATE OF DISCHARGE:  07/11/2014                              OPERATIVE REPORT   PREOPERATIVE DIAGNOSIS:  Left breast mass.  POSTOPERATIVE DIAGNOSIS:  Left breast mass.  PROCEDURE:  Left breast lumpectomy.  SURGEON:  Coralie Keens, M.D.  ANESTHESIA:  General and 0.5% Marcaine.  ESTIMATED BLOOD LOSS:  Minimal.  INDICATIONS:  This is a 43 year old female who has had a previous left breast mass biopsy showing a fibroadenoma.  It has increased in size, and given her family history of breast cancer, decision was made to proceed with a left breast lumpectomy.  FINDINGS:  The patient was indeed found to have a mass which appeared on gross examination to be consistent with the previously biopsied fibroadenoma.  It was sent to Pathology for histologic evaluation.  PROCEDURE IN DETAIL:  The patient was brought to the operating room, identified as [name].  She was placed supine on the operating room table.  General anesthesia was induced.  Her right breast was then prepped and draped in usual sterile fashion.  I anesthetized the skin at the lower edge of the areola with Marcaine.  I then made a circumareolar incision with a scalpel.  I took this down into the breast tissue with electrocautery.  I easily identified the palpable mass along with a small cyst which was adjacent to this.  I performed a wide lumpectomy going down to the chest wall removing the entire mass with what appeared to be a wide margin.  This was then sent to Pathology for evaluation.  I achieved hemostasis with the cautery.  I then anesthetized the wound further with Marcaine.  Hemostasis appeared to be achieved.  I closed the subcutaneous tissue with interrupted 3-0  Vicryl sutures and closed the skin with a running 4-0 Monocryl.  Steri-Strips, gauze, and Tegaderm were then applied.  The patient tolerated the procedure well.  All the counts were correct at the end of procedure.  The patient was then extubated in operating room and taken in stable condition to recovery room.     Coralie Keens, M.D.     DB/MEDQ  D:  07/11/2014  T:  07/11/2014  Job:  [id number]

## 2014-09-11 ENCOUNTER — Other Ambulatory Visit: Payer: Self-pay

## 2014-09-11 DIAGNOSIS — Z1231 Encounter for screening mammogram for malignant neoplasm of breast: Secondary | ICD-10-CM

## 2014-10-15 ENCOUNTER — Ambulatory Visit
Admission: RE | Admit: 2014-10-15 | Discharge: 2014-10-15 | Disposition: A | Discharge status: Home / Self Care | Payer: BLUE CROSS/BLUE SHIELD | Source: Ambulatory Visit

## 2014-10-15 DIAGNOSIS — Z1231 Encounter for screening mammogram for malignant neoplasm of breast: Secondary | ICD-10-CM

## 2015-12-22 IMAGING — MG MM DIGITAL SCREENING BILAT W/ CAD
4 series · 4 of 4 positions shown · non-contrast
Comparison: Previous exam(s).

CLINICAL DATA: Screening.

EXAM:
DIGITAL SCREENING BILATERAL MAMMOGRAM WITH CAD

[R CC]
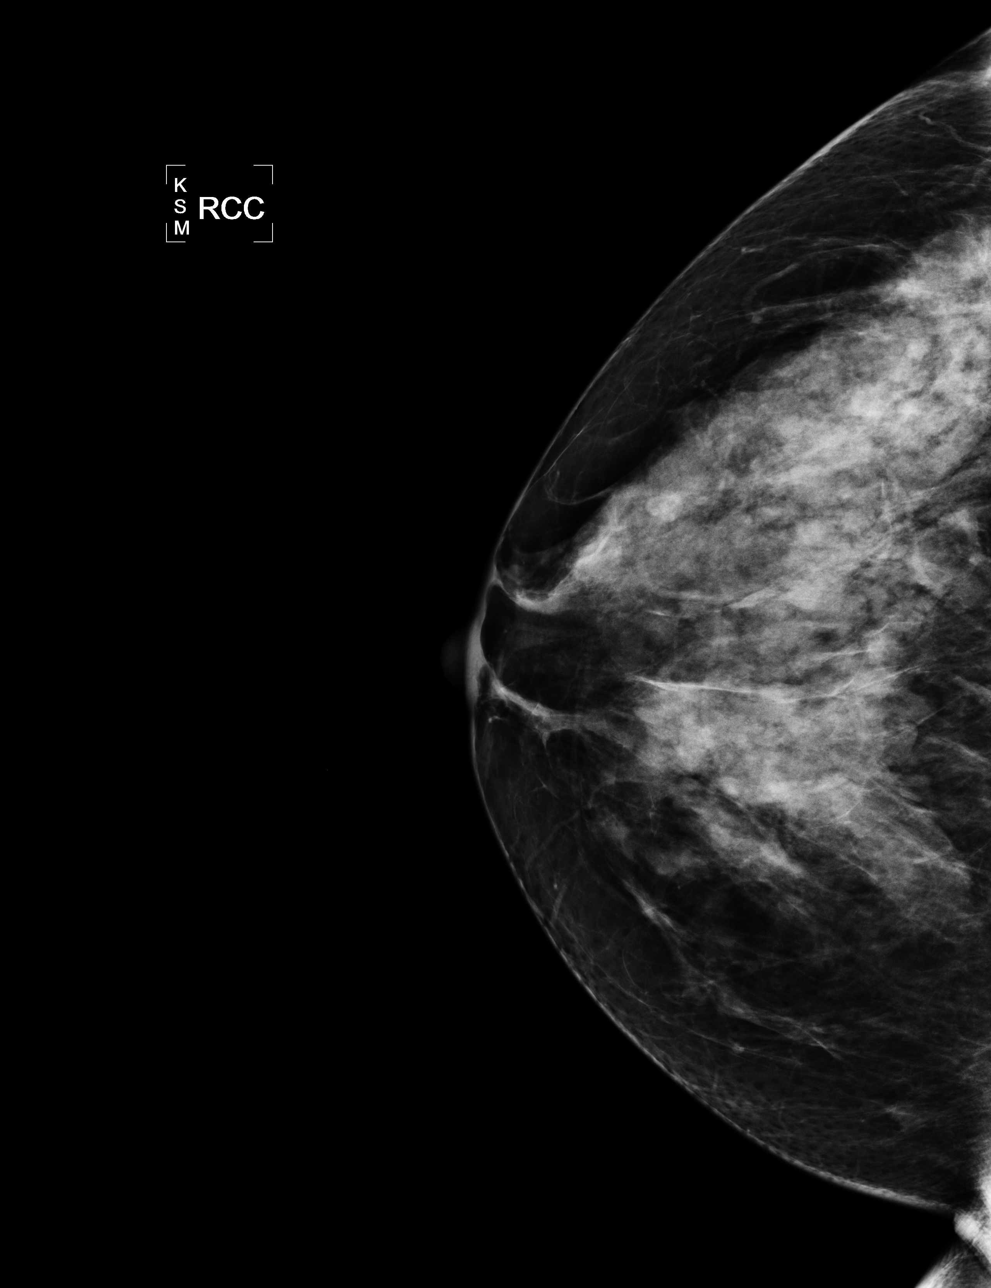

[L CC]
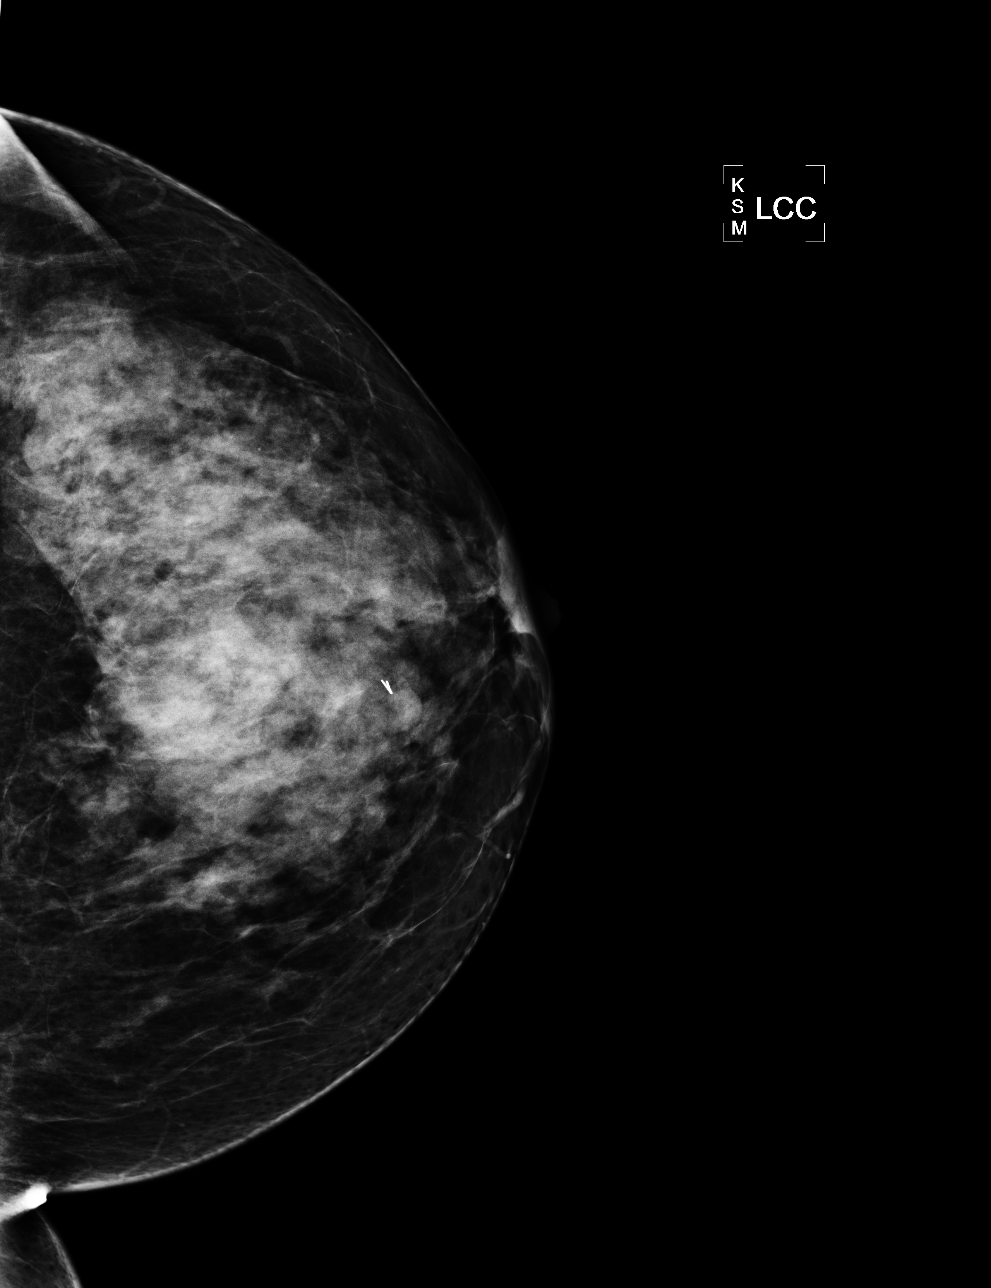

[L MLO]
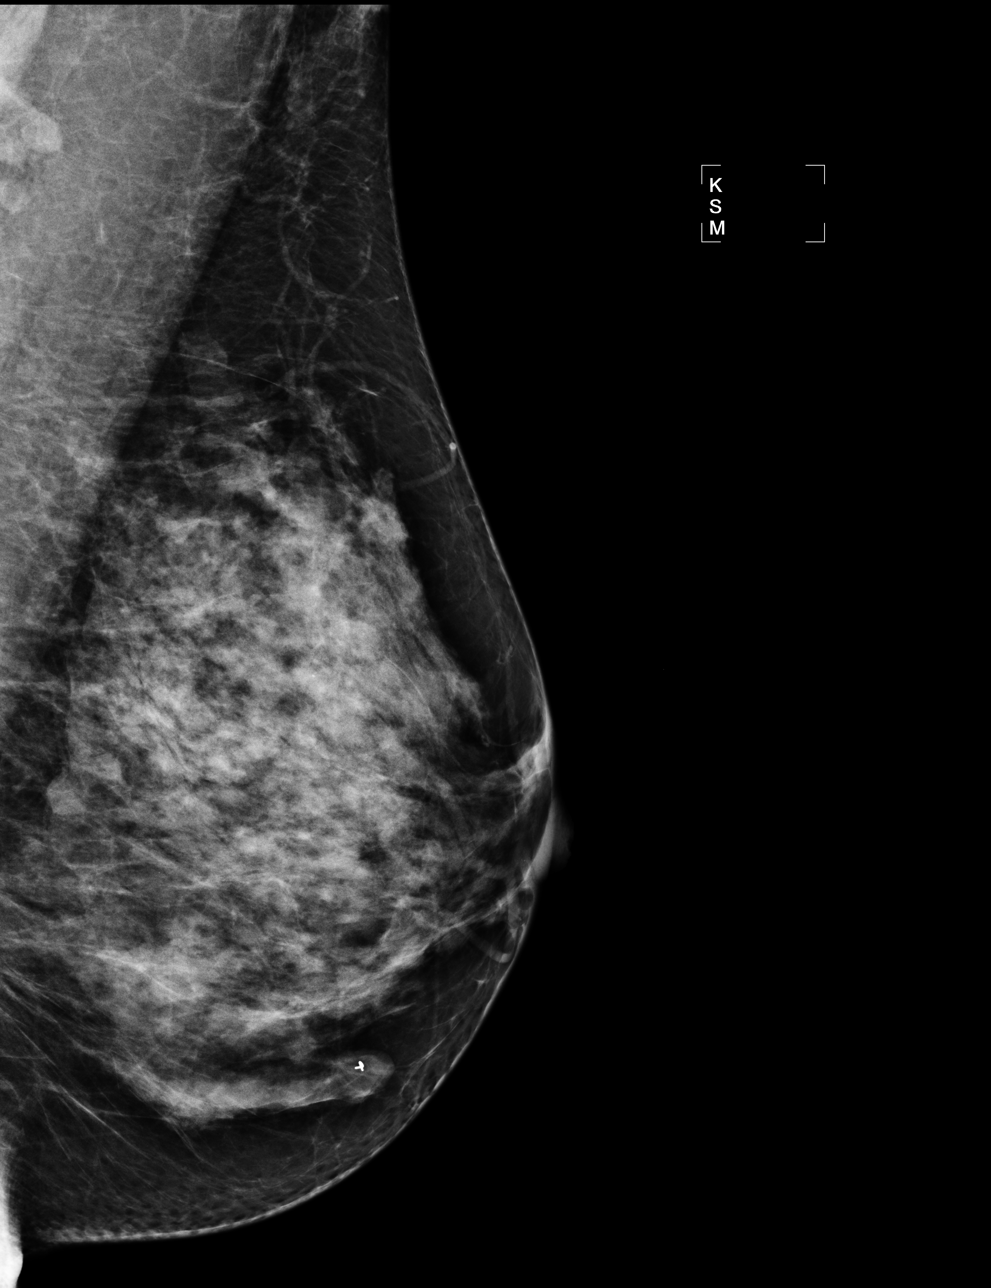

[R MLO]
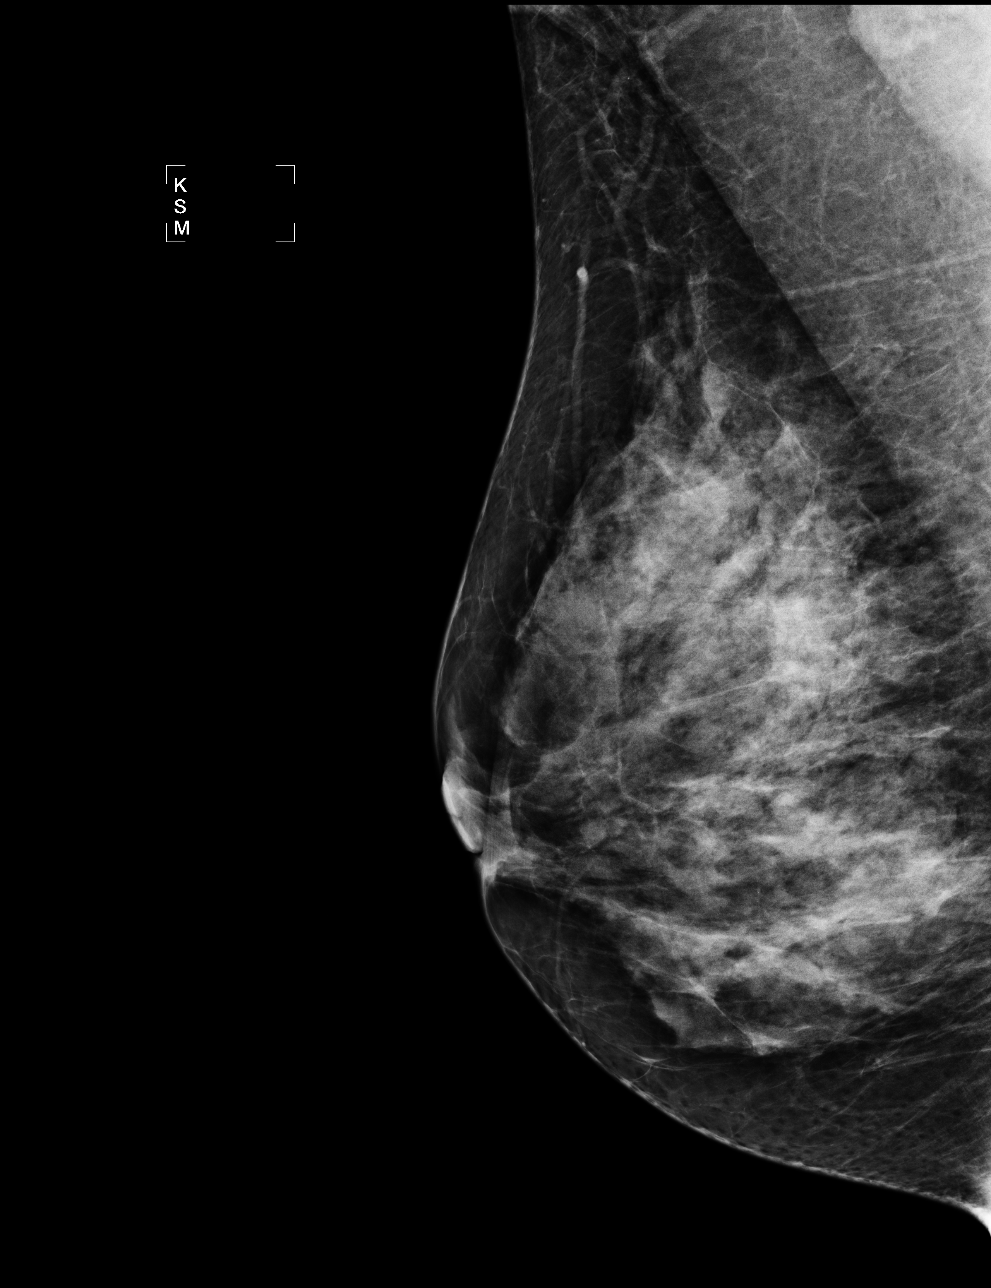

[4 of 4 positions shown; findings below may reference images not displayed]

ACR Breast Density Category d: The breast tissue is extremely dense,
which lowers the sensitivity of mammography.
FINDINGS: There are no findings suspicious for malignancy. Images were
processed with CAD.
IMPRESSION: No mammographic evidence of malignancy. A result letter of this
screening mammogram will be mailed directly to the patient.

RECOMMENDATION:
Screening mammogram in one year. (Code:BD-D-K0F)

BI-RADS CATEGORY  1: Negative.
